# Patient Record
Sex: Female | Born: 1980 | Race: White | Hispanic: No | Marital: Single | Smoking: Former smoker
Health system: Southern US, Community
[De-identification: ages and names within clinical notes are randomized; demographics above are authoritative.]

---

## 2005-05-21 ENCOUNTER — Observation Stay: Payer: Self-pay

## 2007-11-24 IMAGING — US US OB US >=[ID] SNGL FETUS
1 series · 17 of 28 positions shown · non-contrast
Comparison: none

REASON FOR EXAM: Abdominal pain
COMMENTS:

[Series 1: us ob us >=(id) sngl fetus · 17 of 61 slices shown]
[im 1/61]
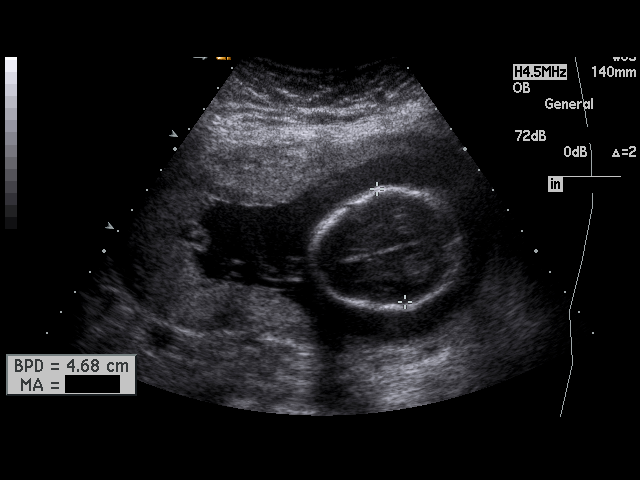
[im 5/61]
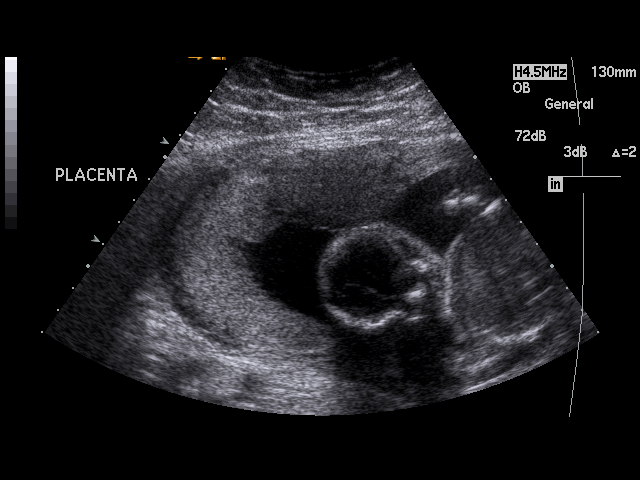
[im 9/61]
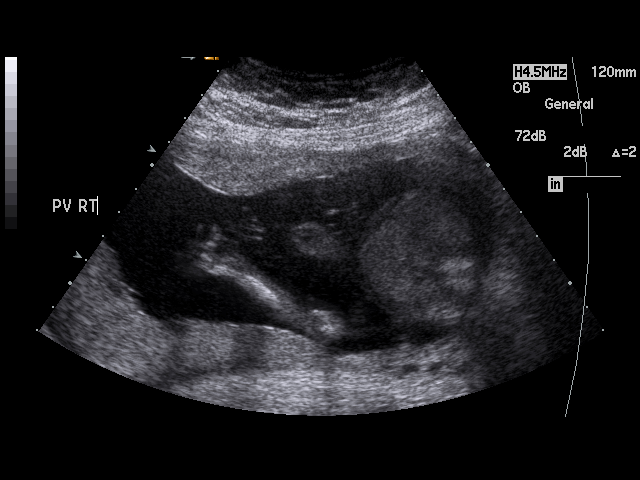
[im 12/61]
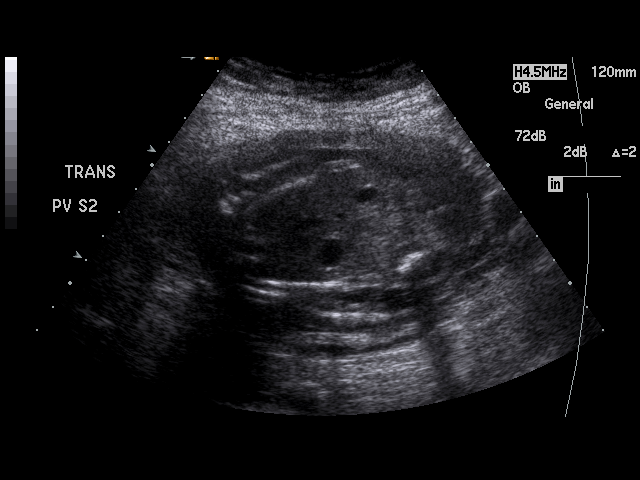
[im 16/61]
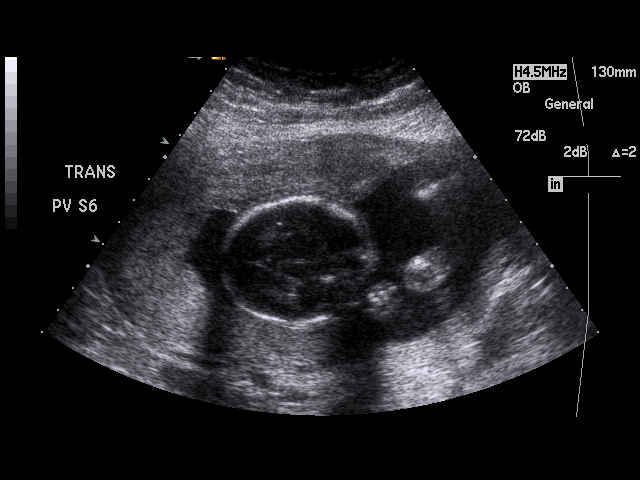
[im 21/61]
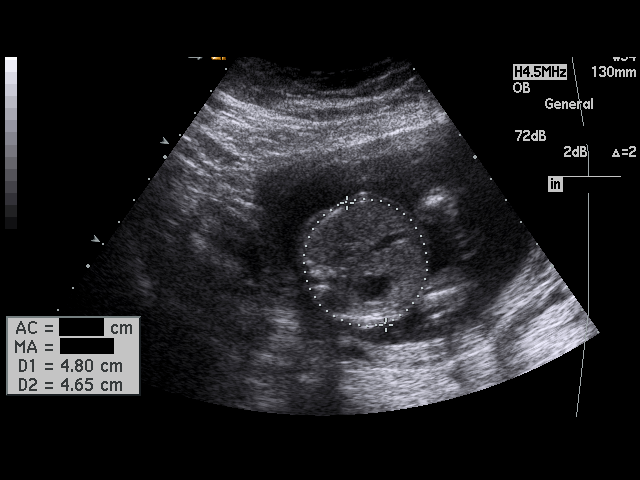
[im 23/61]
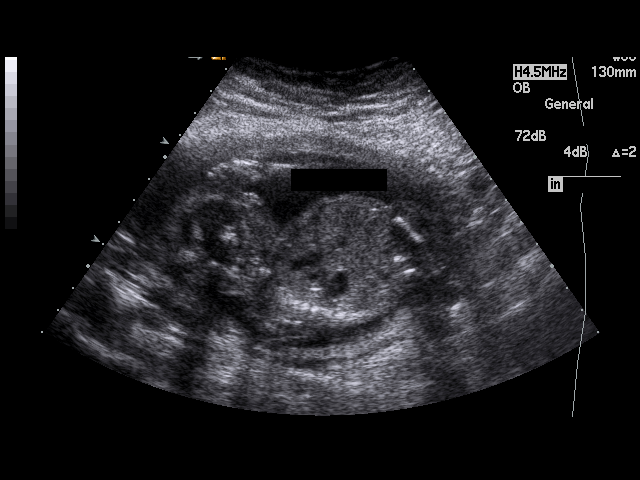
[im 27/61]
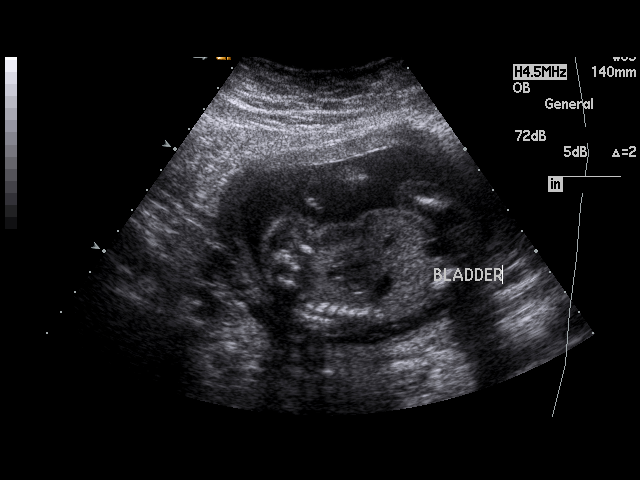
[im 32/61]
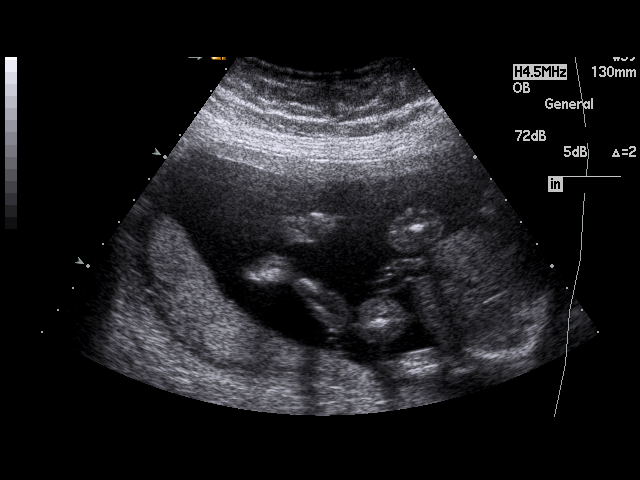
[im 34/61]
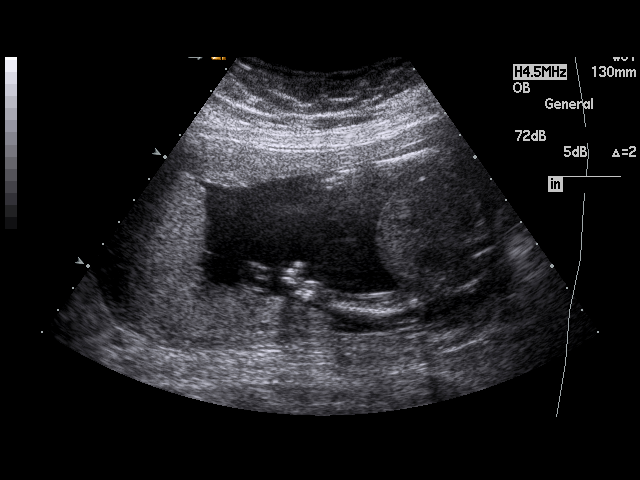
[im 38/61]
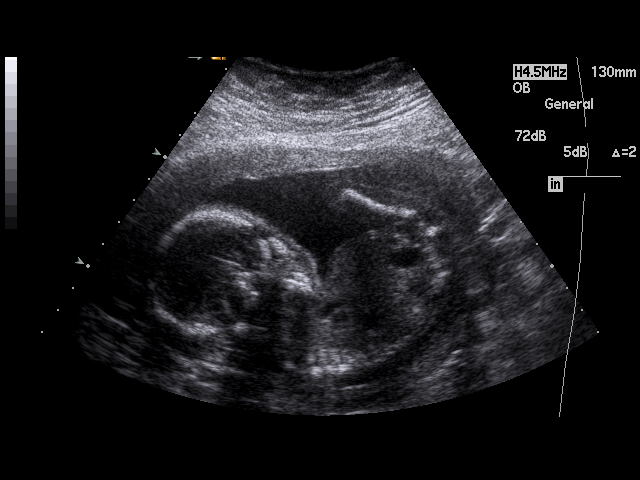
[im 41/61]
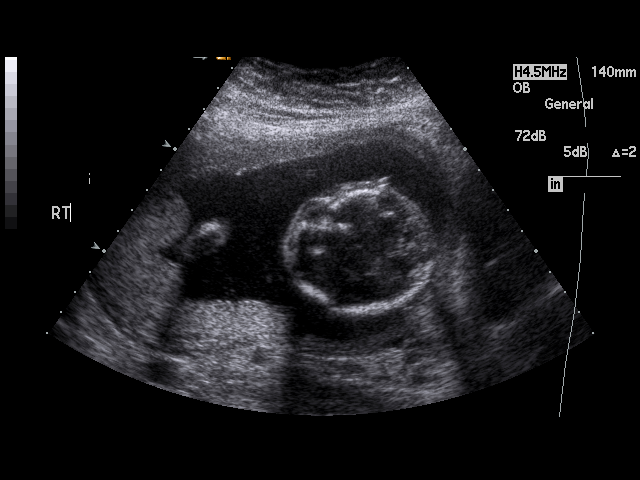
[im 45/61]
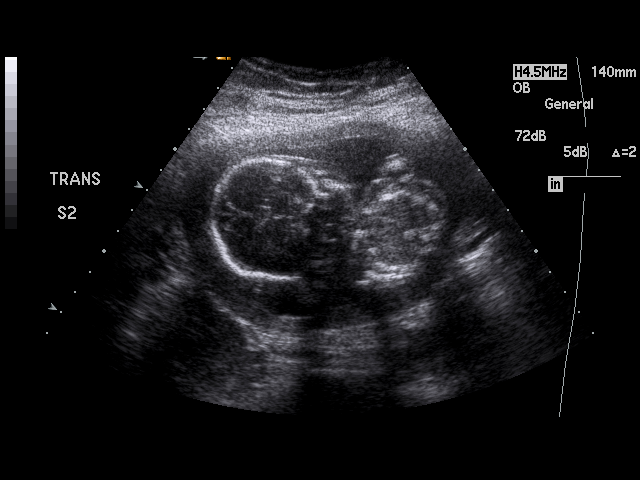
[im 49/61]
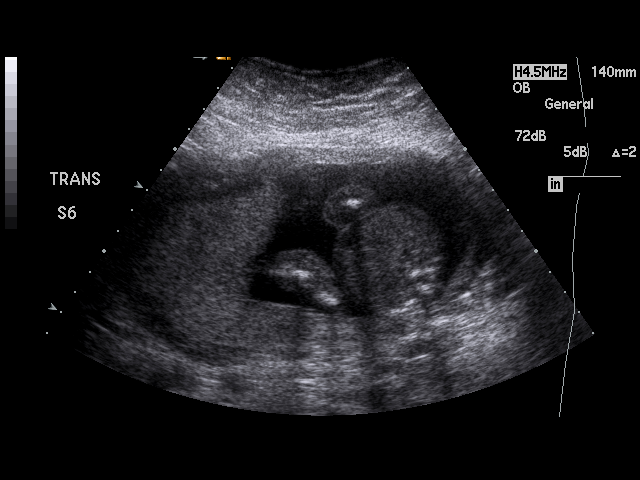
[im 52/61]
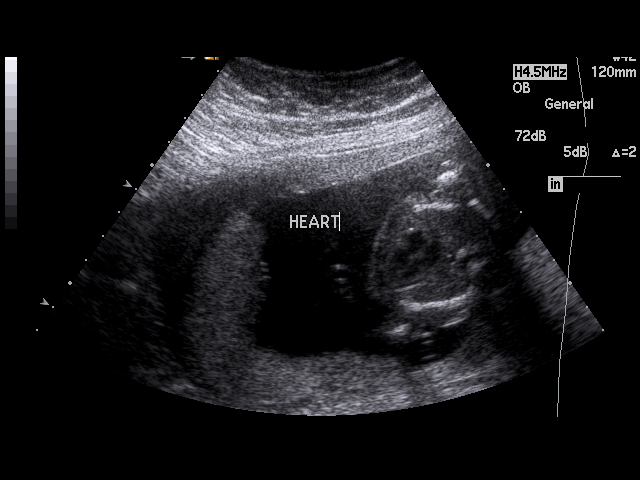
[im 56/61]
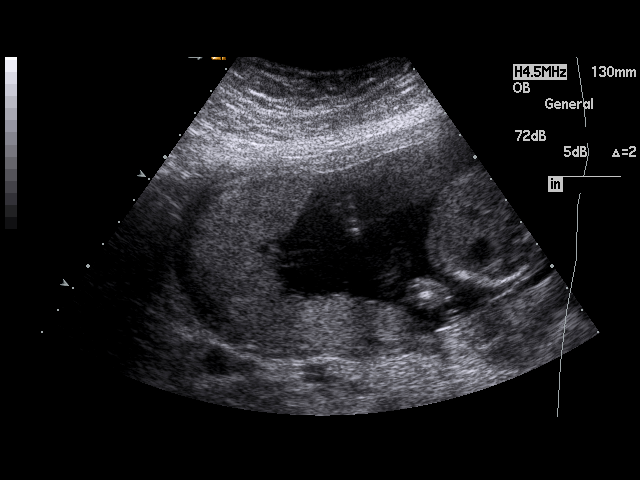
[im 61/61]
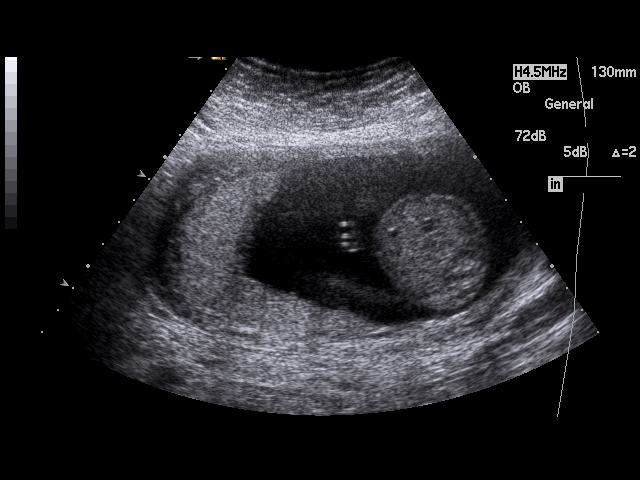

[17 of 28 positions shown; findings below may reference images not displayed]

PROCEDURE:     US  - US OB GREATER/OR EQUAL TO [id number]  - May 21, 2005  [DATE]

RESULT:          The patient is complaining of abdominal discomfort over the
preceding three days. There is no history of bleeding.

There is a viable intrauterine pregnancy currently in breech presentation.
The placenta is posterior and fundal and right-sided. There is no evidence
of placenta previa.

Fetal cardiac activity at a rate of 147 beats per minute was seen.  A
four-chambered heart was demonstrated.  Survey views of the fetal anatomy
demonstrated no anomaly.

Measured parameters:
BPD 4.68 cm, corresponding to an estimated gestational age of 20 weeks and 1
day.
HC 17.7 cm, corresponding to an estimated gestational age of 20 weeks, 1 day.
AC 15.0 cm, corresponding to an estimated gestational age of 20 weeks, 2
days.
FL 3.2 cm, corresponding to an estimated gestational age of 19 weeks, 6
days.
The estimated fetal weight is 357 grams + / - 48 grams.
IMPRESSION: There is a viable intrauterine pregnancy with an
estimated gestational age of 20 weeks, 1 day, + / - approximately 14 days.
The estimated date of confinement is 07 October, 2005.  No fetal anomalies
were identified nor evidence of fetal distress.

A preliminary report was sent to the emergency room at the conclusion of the
study.

## 2008-11-27 ENCOUNTER — Emergency Department: Payer: Self-pay | Admitting: Emergency Medicine

## 2008-11-28 ENCOUNTER — Emergency Department: Payer: Self-pay | Admitting: Emergency Medicine

## 2016-07-23 DIAGNOSIS — I10 Essential (primary) hypertension: Secondary | ICD-10-CM | POA: Insufficient documentation

## 2016-09-25 DIAGNOSIS — Z6841 Body Mass Index (BMI) 40.0 and over, adult: Secondary | ICD-10-CM | POA: Insufficient documentation

## 2020-09-06 NOTE — Progress Notes (Signed)
Sleep Medicine   Office Visit  Patient Name: Nancy Peck DOB: October 12, 1980 MRN [id number]    Chief Complaint: daytime sleepiness  Brief History:  Nancy Peck is here today for a sleep evaluation. This patient has a  history of 5 years of snoring.    Sleep quality is poor. This is noted every night. The patient's bed partner reports  snoring at night. The patient relates the following symptoms: snoring are also present. The patient goes to sleep at 8:30pm and wakes up at 6:30am. Sleep quality is the same when outside home environment.  Patient has noted no restlessness of Nancy Peck legs at night. Patient states she has nightmares sometimes.Patient reports she takes medication to help Nancy Peck go to sleep. Patient reports excessive sleepiness during the day.  Patient reports she gets up at least 10 times during the night. Patient states it is difficult for Nancy Peck to concentrate during the day. HX of ADHD treated with vyvanse. Patient takes a nap every day for 30 minutes. The patient  relates no unusual behavior during the night.  The patient does have a history of psychiatric problems. The Epworth Sleepiness Score is 13 out of 24 .  The patient relates  Cardiovascular risk factors include: HTN.   ROS  General: (-) fever, (-) chills, (-) night sweat Nose and Sinuses: (-) nasal stuffiness or itchiness, (-) postnasal drip, (-) nosebleeds, (-) sinus trouble. Mouth and Throat: (-) sore throat, (-) hoarseness. Neck: (-) swollen glands, (-) enlarged thyroid, (-) neck pain. Respiratory: - cough, - shortness of breath, - wheezing. Neurologic: - numbness, - tingling. Psychiatric: + anxiety, + depression Sleep behavior: -sleep paralysis -hypnogogic hallucinations -dream enactment      -vivid dreams -cataplexy -night terrors -sleep walking   Current Medication: Outpatient Encounter Medications as of 09/07/2020  Medication Sig   hydrochlorothiazide (HYDRODIURIL) 12.5 MG tablet Take 1 tablet by mouth daily.    lurasidone (LATUDA) 40 MG TABS tablet Take by mouth.   medroxyPROGESTERone (DEPO-PROVERA) 150 MG/ML injection Inject into the muscle.   omeprazole (PRILOSEC) 20 MG capsule Take 1 capsule by mouth daily.   OXcarbazepine (TRILEPTAL) 150 MG tablet Take by mouth.   Semaglutide-Weight Management (WEGOVY) 0.25 MG/0.5ML SOAJ Inject into the skin.   vortioxetine HBr (TRINTELLIX) 10 MG TABS tablet Take 1 tablet by mouth at bedtime.   FLUoxetine (PROZAC) 40 MG capsule Take by mouth.   lisdexamfetamine (VYVANSE) 30 MG capsule Take by mouth.   LORazepam (ATIVAN) 0.5 MG tablet Take by mouth.   metFORMIN (GLUCOPHAGE) 500 MG tablet Take 500 mg by mouth 2 (two) times daily.   pregabalin (LYRICA) 200 MG capsule Take 200 mg by mouth 3 (three) times daily.   topiramate (TOPAMAX) 100 MG tablet Take 100 mg by mouth 3 (three) times daily.   zolpidem (AMBIEN) 10 MG tablet Take by mouth.   No facility-administered encounter medications on file as of 09/07/2020.    Surgical History: History reviewed. No pertinent surgical history.  Medical History: History reviewed. No pertinent past medical history.  Family History: Non contributory to the present illness  Social History: Social History   Socioeconomic History   Marital status: Single    Spouse name: Not on file   Number of children: Not on file   Years of education: Not on file   Highest education level: Not on file  Occupational History   Not on file  Tobacco Use   Smoking status: Former    Types: Cigarettes   Smokeless tobacco: Never  Substance and  Sexual Activity   Alcohol use: Not Currently   Drug use: Not on file   Sexual activity: Not on file  Other Topics Concern   Not on file  Social History Narrative   Not on file   Social Determinants of Health   Financial Resource Strain: Not on file  Food Insecurity: Not on file  Transportation Needs: Not on file  Physical Activity: Not on file  Stress: Not on file  Social Connections:  Not on file  Intimate Partner Violence: Not on file    Vital Signs: Blood pressure 129/78, pulse (!) 115, temperature 97.7 F (36.5 C), resp. rate 18, height 5\' 2"  (1.575 m), weight 269 lb (122 kg), SpO2 100 %.  Examination: General Appearance: The patient is well-developed, well-nourished, and in no distress. Neck Circumference: 43 cm Skin: Gross inspection of skin unremarkable. Head: normocephalic, no gross deformities. Eyes: no gross deformities noted. ENT: ears appear grossly normal Neurologic: Alert and oriented. No involuntary movements.    EPWORTH SLEEPINESS SCALE:  Scale:  (0)= no chance of dozing; (1)= slight chance of dozing; (2)= moderate chance of dozing; (3)= high chance of dozing  Chance  Situtation    Sitting and reading: 1    Watching TV: 2    Sitting Inactive in public: 2    As a passenger in car: 2      Lying down to rest: 3    Sitting and talking: 2    Sitting quielty after lunch: 1    In a car, stopped in traffic: 0   TOTAL SCORE:   13 out of 24    SLEEP STUDIES:  none   LABS: No results found for this or any previous visit (from the past 2160 hour(s)).  Radiology: No results found.  No results found.  No results found.    Assessment and Plan: Patient Active Problem List   Diagnosis Date Noted   Depression 09/07/2020   Anxiety 09/07/2020   GERD (gastroesophageal reflux disease) 09/07/2020   PTSD (post-traumatic stress disorder) 09/07/2020   BMI 50.0-59.9, adult (HCC) 09/25/2016   Essential hypertension 07/23/2016     PLAN OSA:   Patient evaluation suggests high risk of sleep disordered breathing due to loud snoring, waking herself up, daytime sleepiness, lack of concentration, GERD, and elevated BMI. Patient has comorbid cardiovascular risk factors including: HTN which could be exacerbated by pathologic sleep-disordered breathing.  Suggest: PSG to assess/treat the patient's sleep disordered breathing. The patient was  also counselled on wt loss to optimize sleep health.  1. Hypersomnia Will order PSG  2. Essential hypertension Well controlled. Continue current medication  3. Anxiety Continue current medication and f/u with PCP.  4. Moderate episode of recurrent major depressive disorder (HCC) Continue current medication and f/u with PCP.  5. Gastroesophageal reflux disease without esophagitis Continue PPI  6. Morbid obesity with BMI of 45.0-49.9, adult (HCC) Obesity Counseling: Had a lengthy discussion regarding patients BMI and weight issues. Patient was instructed on portion control as well as increased activity. Also discussed caloric restrictions with trying to maintain intake less than 2000 Kcal. Discussions were made in accordance with the 5As of weight management. Simple actions such as not eating late and if able to, taking a walk is suggested.  7. ADHD Continue vyvanse as prescribed   General Counseling: I have discussed the findings of the evaluation and examination with Nancy Peck.  I have also discussed any further diagnostic evaluation thatmay be needed or ordered today. Nancy Peck verbalizes understanding of  the findings of todays visit. We also reviewed Nancy Peck medications today and discussed drug interactions and side effects including but not limited excessive drowsiness and altered mental states. We also discussed that there is always a risk not just to Nancy Peck but also people around Nancy Peck. she has been encouraged to call the office with any questions or concerns that should arise related to todays visit.  No orders of the defined types were placed in this encounter.       I have personally obtained a history, evaluated the patient, evaluated pertinent data, formulated the assessment and plan and placed orders.  This patient was seen by Lynn Ito, PA-C in collaboration with Dr. Freda Munro as a part of collaborative care agreement.   Valentino Hue Sol Blazing, PhD, FAASM  Diplomate,  American Board of Sleep Medicine    Yevonne Pax, MD [facility] Diplomate ABMS Pulmonary and Critical Care Medicine Sleep medicine

## 2020-09-07 ENCOUNTER — Ambulatory Visit (INDEPENDENT_AMBULATORY_CARE_PROVIDER_SITE_OTHER): Payer: Medicaid Other | Admitting: Internal Medicine

## 2020-09-07 DIAGNOSIS — I10 Essential (primary) hypertension: Secondary | ICD-10-CM

## 2020-09-07 DIAGNOSIS — G4710 Hypersomnia, unspecified: Secondary | ICD-10-CM

## 2020-09-07 DIAGNOSIS — F4310 Post-traumatic stress disorder, unspecified: Secondary | ICD-10-CM | POA: Insufficient documentation

## 2020-09-07 DIAGNOSIS — F419 Anxiety disorder, unspecified: Secondary | ICD-10-CM

## 2020-09-07 DIAGNOSIS — F909 Attention-deficit hyperactivity disorder, unspecified type: Secondary | ICD-10-CM

## 2020-09-07 DIAGNOSIS — F331 Major depressive disorder, recurrent, moderate: Secondary | ICD-10-CM | POA: Diagnosis not present

## 2020-09-07 DIAGNOSIS — Z6841 Body Mass Index (BMI) 40.0 and over, adult: Secondary | ICD-10-CM

## 2020-09-07 DIAGNOSIS — F32A Depression, unspecified: Secondary | ICD-10-CM | POA: Insufficient documentation

## 2020-09-07 DIAGNOSIS — K219 Gastro-esophageal reflux disease without esophagitis: Secondary | ICD-10-CM

## 2020-09-07 NOTE — Patient Instructions (Signed)
Hypersomnia Hypersomnia is a condition in which a person feels very tired during the day even though he or she gets plenty of sleep at night. A person with this condition may take naps during the day and may find it very difficult to wake up from sleep. Hypersomnia may affect a person's ability to think, concentrate,drive, or remember things. What are the causes? The cause of this condition may not be known. Possible causes include: Certain medicines. Sleep disorders, such as narcolepsy and sleep apnea. Injury to the head, brain, or spinal cord. Drug or alcohol use. Gastroesophageal reflux disease (GERD). Tumors. Certain medical conditions, such as depression, diabetes, or an underactive thyroid gland (hypothyroidism). What are the signs or symptoms? The main symptoms of hypersomnia include: Feeling very tired throughout the day, regardless of how much sleep you got the night before. Having trouble waking up. Others may find it difficult to wake you up when you are sleeping. Sleeping for longer and longer periods at a time. Taking naps throughout the day. Other symptoms may include: Feeling restless, anxious, or annoyed. Lacking energy. Having trouble with: Remembering. Speaking. Thinking. Loss of appetite. Seeing, hearing, tasting, smelling, or feeling things that are not real (hallucinations). How is this diagnosed? This condition may be diagnosed based on: Your symptoms and medical history. Your sleeping habits. Your health care provider may ask you to write down your sleeping habits in a daily sleep log, along with any symptoms you have. A series of tests that are done while you sleep (sleep study or polysomnogram). A test that measures how quickly you can fall asleep during the day (daytime nap study or multiple sleep latency test). How is this treated? Treatment can help you manage your condition. Treatment may include: Following a regular sleep routine. Lifestyle changes,  such as changing your eating habits, getting regular exercise, and avoiding alcohol or caffeinated beverages. Taking medicines to make you more alert (stimulants) during the day. Treating any underlying medical causes of hypersomnia. Follow these instructions at home: Sleep routine  Schedule the same bedtime and wake-up time each day. Practice a relaxing bedtime routine. This may include reading, meditation, deep breathing, or taking a warm bath before going to sleep. Get regular exercise each day. Avoid strenuous exercise in the evening hours. Keep your sleep environment at a cooler temperature, darkened, and quiet. Sleep with pillows and a mattress that are comfortable and supportive. Schedule short 20-minute naps for when you feel sleepiest during the day. Talk with your employer or teachers about your hypersomnia. If possible, adjust your schedule so that: You have a regular daytime work schedule. You can take a scheduled nap during the day. You do not have to work or be active at night. Do not eat a heavy meal for a few hours before bedtime. Eat your meals at about the same times every day. Avoid drinking alcohol or caffeinated beverages.  Safety  Do not drive or use heavy machinery if you are sleepy. Ask your health care provider if it is safe for you to drive. Wear a life jacket when swimming or spending time near water.  General instructions Take supplements and over-the-counter and prescription medicines only as told by your health care provider. Keep a sleep log that will help your doctor manage your condition. This may include information about: What time you go to bed each night. How often you wake up at night. How many hours you sleep at night. How often and for how long you nap during the   day. Any observations from others, such as leg movements during sleep, sleep walking, or snoring. Keep all follow-up visits as told by your health care provider. This is  important. Contact a health care provider if: You have new symptoms. Your symptoms get worse. Get help right away if: You have serious thoughts about hurting yourself or someone else. If you ever feel like you may hurt yourself or others, or have thoughts about taking your own life, get help right away. You can go to your nearest emergency department or call: Your local emergency services (911 in the [place] A suicide crisis helpline, such as the National Suicide Prevention Lifeline at 1-800-273-8255. This is open 24 hours a day. Summary Hypersomnia refers to a condition in which you feel very tired during the day even though you get plenty of sleep at night. A person with this condition may take naps during the day and may find it very difficult to wake up from sleep. Hypersomnia may affect a person's ability to think, concentrate, drive, or remember things. Treatment, such as following a regular sleep routine and making some lifestyle changes, can help you manage your condition. This information is not intended to replace advice given to you by your health care provider. Make sure you discuss any questions you have with your healthcare provider. Document Revised: 12/17/2019 Document Reviewed: 12/17/2019 Elsevier Patient Education  2022 Elsevier Inc.  

## 2021-04-18 NOTE — Progress Notes (Signed)
No show for appointment. Office will call to reschedule.  

## 2021-04-19 ENCOUNTER — Ambulatory Visit: Payer: Medicare Other | Admitting: Internal Medicine

## 2021-05-02 NOTE — Progress Notes (Signed)
[facility] ?[address] ?[place], [place] [address] ? ?Pulmonary Sleep Medicine  ? ?Office Visit Note ? ?Patient Name: Nancy Peck ?DOB: 02/13/1981 ?MRN [id number] ? ? ? ?Chief Complaint: Obstructive Sleep Apnea visit ? ?Brief History: ? ?Nancy Peck is seen today for a follow up visit for CPAP@ 6 cmH2O. The patient has a 6 month history of sleep apnea. Patient is using PAP nightly.  The patient feels rested after sleeping with PAP.  The patient reports benefiting from PAP use especially since she is sleeping longer with the CPAP. Reported sleepiness is  improved and the Epworth Sleepiness Score is 10 out of 24. The patient does not take naps. She reports that she used to take naps before CPAP. The patient complains of the following: no issues at this time.  The compliance download shows 80% compliance with an average use time of 6 hours 32 minutes. The AHI is 0.2.  The patient does not complain of limb movements disrupting sleep. ? ?ROS ? ?General: (-) fever, (-) chills, (-) night sweat ?Nose and Sinuses: (-) nasal stuffiness or itchiness, (-) postnasal drip, (-) nosebleeds, (-) sinus trouble. ?Mouth and Throat: (-) sore throat, (-) hoarseness. ?Neck: (-) swollen glands, (-) enlarged thyroid, (-) neck pain. ?Respiratory: - cough, - shortness of breath, - wheezing. ?Neurologic: - numbness, - tingling. ?Psychiatric: - anxiety, - depression ? ? ?Current Medication: ?Outpatient Encounter Medications as of 05/03/2021  ?Medication Sig  ? FLUoxetine (PROZAC) 40 MG capsule Take by mouth.  ? hydrochlorothiazide (HYDRODIURIL) 12.5 MG tablet Take 1 tablet by mouth daily.  ? lisdexamfetamine (VYVANSE) 30 MG capsule Take by mouth.  ? LORazepam (ATIVAN) 0.5 MG tablet Take by mouth.  ? lurasidone (LATUDA) 40 MG TABS tablet Take by mouth.  ? medroxyPROGESTERone (DEPO-PROVERA) 150 MG/ML injection Inject into the muscle.  ? metFORMIN (GLUCOPHAGE) 500 MG tablet Take 500 mg by mouth 2 (two) times daily.  ?  omeprazole (PRILOSEC) 20 MG capsule Take 1 capsule by mouth daily.  ? OXcarbazepine (TRILEPTAL) 150 MG tablet Take by mouth.  ? pregabalin (LYRICA) 200 MG capsule Take 200 mg by mouth 3 (three) times daily.  ? topiramate (TOPAMAX) 100 MG tablet Take 100 mg by mouth 3 (three) times daily.  ? traZODone (DESYREL) 100 MG tablet Take by mouth.  ? vortioxetine HBr (TRINTELLIX) 10 MG TABS tablet Take 1 tablet by mouth at bedtime.  ? [DISCONTINUED] zolpidem (AMBIEN) 10 MG tablet Take by mouth.  ? ?No facility-administered encounter medications on file as of 05/03/2021.  ? ? ?Surgical History: ?History reviewed. No pertinent surgical history. ? ?Medical History: ?History reviewed. No pertinent past medical history. ? ?Family History: ?Non contributory to the present illness ? ?Social History: ?Social History  ? ?Socioeconomic History  ? Marital status: Single  ?  Spouse name: Not on file  ? Number of children: Not on file  ? Years of education: Not on file  ? Highest education level: Not on file  ?Occupational History  ? Not on file  ?Tobacco Use  ? Smoking status: Former  ?  Types: Cigarettes  ? Smokeless tobacco: Never  ?Substance and Sexual Activity  ? Alcohol use: Not Currently  ? Drug use: Not on file  ? Sexual activity: Not on file  ?Other Topics Concern  ? Not on file  ?Social History Narrative  ? Not on file  ? ?Social Determinants of Health  ? ?Financial Resource Strain: Not on file  ?Food Insecurity: Not on file  ?Transportation Needs: Not  on file  ?Physical Activity: Not on file  ?Stress: Not on file  ?Social Connections: Not on file  ?Intimate Partner Violence: Not on file  ? ? ?Vital Signs: ?Blood pressure 135/81, pulse 94, resp. rate 16, height 5\' 2"  (1.575 m), weight 232 lb (105.2 kg), SpO2 98 %. ?Body mass index is 42.43 kg/m?.  ? ? ?Examination: ?General Appearance: The patient is well-developed, well-nourished, and in no distress. ?Neck Circumference: 39 cm ?Skin: Gross inspection of skin  unremarkable. ?Head: normocephalic, no gross deformities. ?Eyes: no gross deformities noted. ?ENT: ears appear grossly normal ?Neurologic: Alert and oriented. No involuntary movements. ? ? ? ?EPWORTH SLEEPINESS SCALE: ? ?Scale:  ?(0)= no chance of dozing; (1)= slight chance of dozing; (2)= moderate chance of dozing; (3)= high chance of dozing ? ?Chance  Situtation ?   ?Sitting and reading: 1 ?  ? Watching TV: 1 ?   ?Sitting Inactive in public: 1 ?   ?As a passenger in car: 2   ?   ?Lying down to rest: 2 ?   ?Sitting and talking: 1 ?   ?Sitting quielty after lunch: 2 ?   ?In a car, stopped in traffic: 0 ? ? ?TOTAL SCORE:   10 out of 24 ? ? ? ?SLEEP STUDIES: ? ?PSG (09/2020) AHI 5/hr, REM AHI 25/hr, min SpO2 85% ?Titration (09/2020) CPAP@ 6 cmH2O ? ? ?CPAP COMPLIANCE DATA: ? ?Date Range: 04/01/2021-04/30/2021 ? ?Average Daily Use: 6 hours 32 minutes ? ?Median Use: 6 hours 23 minutes ? ?Compliance for > 4 Hours: 100% ? ?AHI: 0.2 respiratory events per hour ? ?Days Used: 30/30 days ? ?Mask Leak: 14.6 ? ?95th Percentile Pressure: 6 ? ? ? ? ? ? ? ? ?LABS: ?No results found for this or any previous visit (from the past 2160 hour(s)). ? ?Radiology: ?No results found. ? ?No results found. ? ?No results found. ? ? ? ?Assessment and Plan: ?Patient Active Problem List  ? Diagnosis Date Noted  ? Depression 09/07/2020  ? Anxiety 09/07/2020  ? GERD (gastroesophageal reflux disease) 09/07/2020  ? PTSD (post-traumatic stress disorder) 09/07/2020  ? BMI 50.0-59.9, adult (HCC) 09/25/2016  ? Essential hypertension 07/23/2016  ? ? ? ? ?The patient does tolerate PAP and reports benefit from PAP use. The patient was reminded how to adjust mask fit and advised to change supplies regularly. The patient was also counselled on nightly use. The compliance is very good. The AHI is 0.2. ? ?1. OSA (obstructive sleep apnea) ?Continue nightly use ? ?2. CPAP use counseling ?CPAP couseling-Discussed importance of adequate CPAP use as well as proper  care and cleaning techniques of machine and all supplies. ? ?3. Essential hypertension ?Continue current medication and f/u with PCP. ? ?4. Moderate episode of recurrent major depressive disorder (HCC) ?Continue current medication and f/u with PCP. ? ?5. Anxiety ?Continue current medication and f/u with PCP. ? ?6. Gastroesophageal reflux disease without esophagitis ?Continue PPI ? ?7. Attention deficit hyperactivity disorder (ADHD), unspecified ADHD type ?Continues vyvance as prescribed ? ?8. Morbid obesity with BMI of 40.0-44.9, adult (HCC) ?Obesity Counseling: Had a lengthy discussion regarding patients BMI and weight issues. Patient was instructed on portion control as well as increased activity. Also discussed caloric restrictions with trying to maintain intake less than 2000 Kcal. Discussions were made in accordance with the 5As of weight management. Simple actions such as not eating late and if able to, taking a walk is suggested. ? ? ? ?General Counseling: I have discussed the findings of  the evaluation and examination with Marvelyn.  I have also discussed any further diagnostic evaluation thatmay be needed or ordered today. Chevon verbalizes understanding of the findings of todays visit. We also reviewed her medications today and discussed drug interactions and side effects including but not limited excessive drowsiness and altered mental states. We also discussed that there is always a risk not just to her but also people around her. she has been encouraged to call the office with any questions or concerns that should arise related to todays visit. ? ?No orders of the defined types were placed in this encounter. ?  ? ? ? ? ?I have personally obtained a history, examined the patient, evaluated laboratory and imaging results, formulated the assessment and plan and placed orders. ? ?This patient was seen by Lynn Ito, PA-C in collaboration with Dr. Freda Munro as a part of collaborative care  agreement. ? ?Yevonne Pax, MD FCCP ?Diplomate ABMS Pulmonary Critical Care Medicine and Sleep Medicine ?

## 2021-05-03 ENCOUNTER — Ambulatory Visit (INDEPENDENT_AMBULATORY_CARE_PROVIDER_SITE_OTHER): Payer: Medicare Other | Admitting: Internal Medicine

## 2021-05-03 VITALS — BP 135/81 | HR 94 | Resp 16 | Ht 62.0 in | Wt 232.0 lb

## 2021-05-03 DIAGNOSIS — I10 Essential (primary) hypertension: Secondary | ICD-10-CM | POA: Diagnosis not present

## 2021-05-03 DIAGNOSIS — F419 Anxiety disorder, unspecified: Secondary | ICD-10-CM

## 2021-05-03 DIAGNOSIS — Z6841 Body Mass Index (BMI) 40.0 and over, adult: Secondary | ICD-10-CM | POA: Diagnosis not present

## 2021-05-03 DIAGNOSIS — Z7189 Other specified counseling: Secondary | ICD-10-CM

## 2021-05-03 DIAGNOSIS — F331 Major depressive disorder, recurrent, moderate: Secondary | ICD-10-CM | POA: Diagnosis not present

## 2021-05-03 DIAGNOSIS — G4733 Obstructive sleep apnea (adult) (pediatric): Secondary | ICD-10-CM

## 2021-05-03 DIAGNOSIS — K219 Gastro-esophageal reflux disease without esophagitis: Secondary | ICD-10-CM

## 2021-05-03 DIAGNOSIS — F909 Attention-deficit hyperactivity disorder, unspecified type: Secondary | ICD-10-CM

## 2021-05-03 NOTE — Patient Instructions (Signed)

## 2022-04-30 NOTE — Progress Notes (Signed)
Pt rescheduled

## 2022-05-02 ENCOUNTER — Ambulatory Visit: Payer: Medicare Other | Admitting: Internal Medicine

## 2022-05-02 DIAGNOSIS — Z91199 Patient's noncompliance with other medical treatment and regimen due to unspecified reason: Secondary | ICD-10-CM
